# Patient Record
Sex: Female | Born: 1950 | Race: White | Hispanic: No | Smoking: Current every day smoker
Health system: Southern US, Community
[De-identification: ages and names within clinical notes are randomized; demographics above are authoritative.]

## PROBLEM LIST (undated history)

## (undated) DIAGNOSIS — M1990 Unspecified osteoarthritis, unspecified site: Secondary | ICD-10-CM

## (undated) DIAGNOSIS — I10 Essential (primary) hypertension: Secondary | ICD-10-CM

## (undated) HISTORY — PX: ABDOMINAL HYSTERECTOMY: SHX81

## (undated) HISTORY — PX: CHOLECYSTECTOMY: SHX55

---

## 2017-06-02 ENCOUNTER — Encounter (HOSPITAL_COMMUNITY): Payer: Self-pay | Admitting: *Deleted

## 2017-06-02 ENCOUNTER — Emergency Department (HOSPITAL_COMMUNITY)
Admission: EM | Admit: 2017-06-02 | Discharge: 2017-06-02 | Disposition: A | Discharge status: Home / Self Care | Payer: Medicare Other | Attending: Emergency Medicine | Admitting: Emergency Medicine

## 2017-06-02 ENCOUNTER — Emergency Department (HOSPITAL_COMMUNITY): Payer: Medicare Other

## 2017-06-02 DIAGNOSIS — F17200 Nicotine dependence, unspecified, uncomplicated: Secondary | ICD-10-CM | POA: Insufficient documentation

## 2017-06-02 DIAGNOSIS — Y929 Unspecified place or not applicable: Secondary | ICD-10-CM | POA: Diagnosis not present

## 2017-06-02 DIAGNOSIS — Y939 Activity, unspecified: Secondary | ICD-10-CM | POA: Diagnosis not present

## 2017-06-02 DIAGNOSIS — Y999 Unspecified external cause status: Secondary | ICD-10-CM | POA: Insufficient documentation

## 2017-06-02 DIAGNOSIS — W07XXXA Fall from chair, initial encounter: Secondary | ICD-10-CM | POA: Diagnosis not present

## 2017-06-02 DIAGNOSIS — I10 Essential (primary) hypertension: Secondary | ICD-10-CM | POA: Diagnosis not present

## 2017-06-02 DIAGNOSIS — S79911A Unspecified injury of right hip, initial encounter: Secondary | ICD-10-CM | POA: Diagnosis present

## 2017-06-02 DIAGNOSIS — S32591A Other specified fracture of right pubis, initial encounter for closed fracture: Secondary | ICD-10-CM | POA: Insufficient documentation

## 2017-06-02 HISTORY — DX: Essential (primary) hypertension: I10

## 2017-06-02 HISTORY — DX: Unspecified osteoarthritis, unspecified site: M19.90

## 2017-06-02 MED ORDER — TRAMADOL HCL 50 MG PO TABS: 50.0000 mg | ORAL_TABLET | Freq: Four times a day (QID) | ORAL | 0 refills | Status: AC | PRN

## 2017-06-02 MED ORDER — MORPHINE SULFATE (PF) 4 MG/ML IV SOLN
4.0000 mg | Freq: Once | INTRAVENOUS | Status: AC
  Administered 2017-06-02: 4 mg via INTRAVENOUS
  Filled 2017-06-02: qty 1

## 2017-06-02 NOTE — ED Notes (Signed)
Pt refusing to lay in bed at this time due to pain. Pt in wheelchair.

## 2017-06-02 NOTE — ED Triage Notes (Signed)
Pt fell from standing in a chair today, c/o right hip pain, pt denies hitting her head.  Denies on blood thinners.

## 2017-06-02 NOTE — ED Provider Notes (Signed)
[facility] EMERGENCY DEPARTMENT Provider Note   CSN: [id number] Arrival date & time: 06/02/17  1356     History   Chief Complaint Chief Complaint  Patient presents with  . Fall    rt hip pain    HPI Melissa Byrd is a 67 y.o. female.  HPI Patient presents to the emergency room for evaluation of hip and back pain.  Nursing notes indication that the patient fell today however she tells me this actually occurred on Thursday.  Patient was attempting to get off a chair when she fell landing on her right side and hip.  Patient has been having pain in the right groin area as well as her buttock.  She has been trying to treat with home medications.  She borrowed a walker that she can sit on and has been using that to get around.  It hurts whenever she tries to stand.  Also hurts in certain positions.  She said difficulty sleeping because of the pain.  She decided to come into the emergency room for evaluation.  No numbness or weakness.  No loss of consciousness. Past Medical History:  Diagnosis Date  . Arthritis   . Hypertension     There are no active problems to display for this patient.   Past Surgical History:  Procedure Laterality Date  . ABDOMINAL HYSTERECTOMY    . CHOLECYSTECTOMY      OB History    No data available       Home Medications    Prior to Admission medications   Medication Sig Start Date End Date Taking? Authorizing Provider  traMADol (ULTRAM) 50 MG tablet Take 1 tablet (50 mg total) by mouth every 6 (six) hours as needed. 06/02/17   Linwood DibblesKnapp, Eira Alpert, MD    Family History History reviewed. No pertinent family history.  Social History Social History   Tobacco Use  . Smoking status: Current Every Day Smoker  . Smokeless tobacco: Never Used  Substance Use Topics  . Alcohol use: No    Frequency: Never  . Drug use: No     Allergies   Patient has no known allergies.   Review of Systems Review of Systems  All other systems reviewed and are  negative.    Physical Exam Updated Vital Signs BP 127/88   Pulse 95   Temp (!) 97.1 F (36.2 C) (Temporal)   Resp 16   Ht 1.549 m (5\' 1" )   Wt 61.7 kg (136 lb)   SpO2 95%   BMI 25.70 kg/m   Physical Exam  Constitutional: She appears well-developed and well-nourished. No distress.  HENT:  Head: Normocephalic and atraumatic.  Right Ear: External ear normal.  Left Ear: External ear normal.  Eyes: Conjunctivae are normal. Right eye exhibits no discharge. Left eye exhibits no discharge. No scleral icterus.  Neck: Neck supple. No tracheal deviation present.  Cardiovascular: Normal rate.  Pulmonary/Chest: Effort normal. No stridor. No respiratory distress.  Abdominal: She exhibits no distension.  Musculoskeletal: She exhibits tenderness. She exhibits no edema.       Right shoulder: Normal.       Right elbow: Normal.      Right hip: She exhibits tenderness and bony tenderness. She exhibits normal range of motion and no swelling.  The patient is able to flex her hip without difficulty  Neurological: She is alert. Cranial nerve deficit: no gross deficits.  Skin: Skin is warm and dry. No rash noted.  Psychiatric: She has a normal mood and  affect.  Nursing note and vitals reviewed.    ED Treatments / Results      Radiology Dg Lumbar Spine Complete  Result Date: 06/02/2017 CLINICAL DATA:  66 y/o  female status post fall with pain. EXAM: LUMBAR SPINE - COMPLETE 4+ VIEW COMPARISON:  Right hip series today reported separately. FINDINGS: Osteopenia in the spine. Calcified aortic atherosclerosis. Cholecystectomy surgical clips. Normal lumbar segmentation. Mild grade 1 anterolisthesis of L5 on S1. Associated facet hypertrophy, but no associated L5 pars fractures are identified. Lumbar and visible lower thoracic vertebral body height appears maintained. Disc space loss and endplate spurring in the lower thoracic spine and T12-L1. Relatively preserved lumbar disc spaces. SI joints appear  intact. On the lateral view no sacral fracture is evident. Abundant bowel gas in the abdomen but nonobstructed bowel-gas pattern. IMPRESSION: 1. Osteopenia.  No acute fracture identified in the lumbar spine. If symptoms persist or occult compression fracture is suspected Lumbar MRI without contrast or Nuclear Medicine Whole-body Bone Scan would evaluate with the highest sensitivity. 2. Chronic appearing grade 1 spondylolisthesis at L5-S1 with facet arthropathy. 3.  Aortic Atherosclerosis (ICD10-I70.0). Electronically Signed   By: Odessa Fleming M.D.   On: 06/02/2017 15:53   Ct Hip Right Wo Contrast  Result Date: 06/02/2017 CLINICAL DATA:  Right hip pain after falling from a chair today. EXAM: CT OF THE RIGHT HIP WITHOUT CONTRAST TECHNIQUE: Multidetector CT imaging of the right hip was performed according to the standard protocol. Multiplanar CT image reconstructions were also generated. COMPARISON:  Right hip radiograph same date. FINDINGS: Bones/Joint/Cartilage Examination is limited to the right hip and inferior right hemipelvis. The bones are adequately mineralized. The femoral head is located. There is no evidence of proximal femur fracture, dislocation or avascular necrosis. There is mild irregularity of the inferior pubic ramus on the axial images which could reflect a nondisplaced fracture. No definite abnormality of the superior pubic ramus, symphysis pubis or right sacroiliac joint. No significant right hip joint effusion or arthropathy. Ligaments Suboptimally assessed by CT. Muscles and Tendons The right hip muscles and tendons appear unremarkable. Soft tissues Iliofemoral atherosclerosis noted. Mild sigmoid colon diverticular changes. No evidence of periarticular hematoma. IMPRESSION: 1. No evidence of acute right femur fracture or dislocation. 2. Possible nondisplaced fracture of the right inferior pubic ramus. 3. Iliofemoral atherosclerosis. Electronically Signed   By: Carey Bullocks M.D.   On: 06/02/2017  17:01   Dg Hip Unilat W Or Wo Pelvis 2-3 Views Right  Result Date: 06/02/2017 CLINICAL DATA:  67 y/o   female status post fall with pain. EXAM: DG HIP (WITH OR WITHOUT PELVIS) 2-3V RIGHT COMPARISON:  None. FINDINGS: Femoral heads are normally located. Hip joint spaces are preserved. Bone mineralization is within normal limits for age. Pelvis intact. SI joints appear symmetric. Grossly intact proximal left femur. Proximal right femur is intact. Calcified iliofemoral atherosclerosis. Negative visible bowel gas pattern. IMPRESSION: No acute fracture or dislocation identified about the right hip or pelvis. Electronically Signed   By: Odessa Fleming M.D.   On: 06/02/2017 15:49    Procedures Procedures (including critical care time)  Medications Ordered in ED Medications  morphine 4 MG/ML injection 4 mg (4 mg Intravenous Given 06/02/17 1633)     Initial Impression / Assessment and Plan / ED Course  I have reviewed the triage vital signs and the nursing notes.  Pertinent labs & imaging results that were available during my care of the patient were reviewed by me and considered in my  medical decision making (see chart for details).  Clinical Course as of Jun 03 1719  Sat Jun 02, 2017  1611 Pt initially did not want pain medications.  Just wanted xrays.  Now would like pain meds.  Since she is having trouble standing, will ct to evaluate for occult fx  [JK]    Clinical Course User Index [JK] Linwood Dibbles, MD    Patient CT scan suggest the possibility of a pubic ramus fracture.  This correlates with the symptoms she is having.  Fortunately no evidence of hip fracture.  Patient was treated with pain medications.  She has a walker that she is using at home.  Plan on discharge home with prescription for pain medications.  Discussed outpatient follow-up with orthopedics.  Final Clinical Impressions(s) / ED Diagnoses   Final diagnoses:  Closed fracture of right inferior pubic ramus, initial encounter [facility])      ED Discharge Orders        Ordered    traMADol (ULTRAM) 50 MG tablet  Every 6 hours PRN     06/02/17 1718       Linwood Dibbles, MD 06/02/17 1721

## 2017-06-02 NOTE — Discharge Instructions (Signed)
Take the medications as needed for pain.  Continue use the walker to help keep off of your leg.  This fracture should heal over time without any surgery.  Follow-up with an orthopedic doctor to make sure it is healing properly.  I have listed the name of the orthopedic doctors.  [place] and Dr. Romeo AppleHarrison is based in [place]

## 2019-01-30 IMAGING — CT CT HIP*R* W/O CM
2 of 3 series · 17 of 46 positions shown, 19 images · non-contrast
Comparison: Right hip radiograph same date.

CLINICAL DATA: Right hip pain after falling from a chair today.

EXAM:
CT OF THE RIGHT HIP WITHOUT CONTRAST
TECHNIQUE: Multidetector CT imaging of the right hip was performed according to
the standard protocol. Multiplanar CT image reconstructions were
also generated.

[Series 3: axial st · axial · 0.43mm/px · z∈[+824,+972]mm · 14 of 86 slices shown, 16 images]
[im 6/86  soft-tissue]
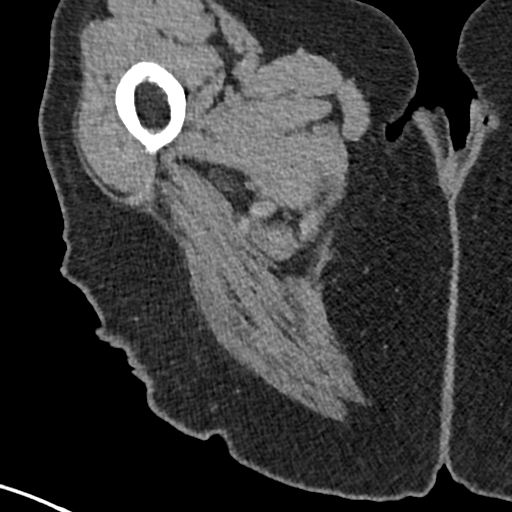
[im 6/86  bone]
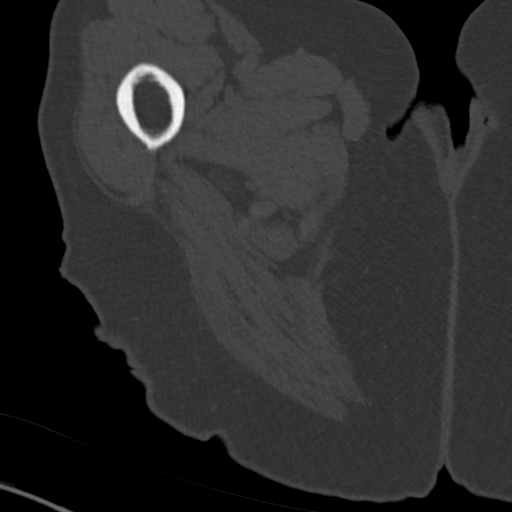
[im 11/86  soft-tissue]
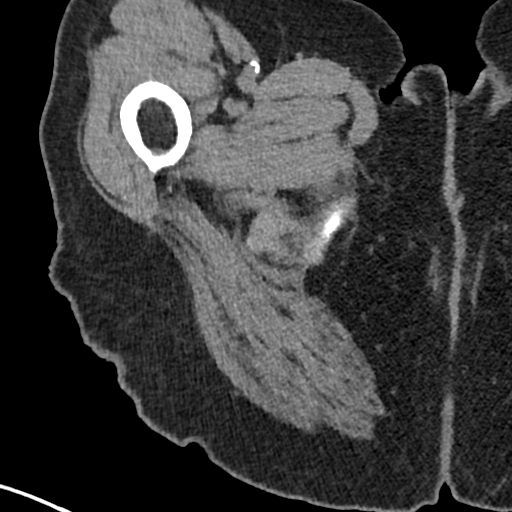
[im 17/86  soft-tissue]
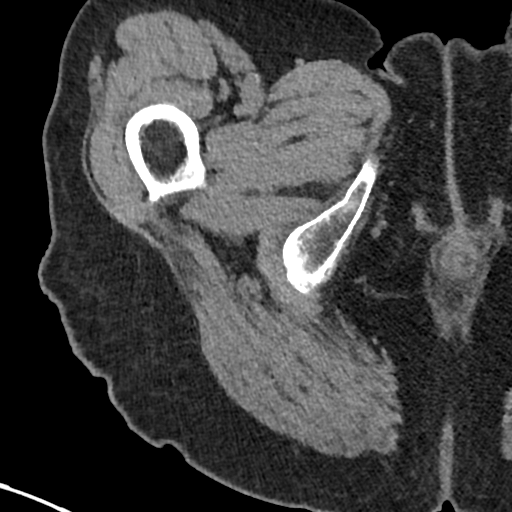
[im 22/86  soft-tissue]
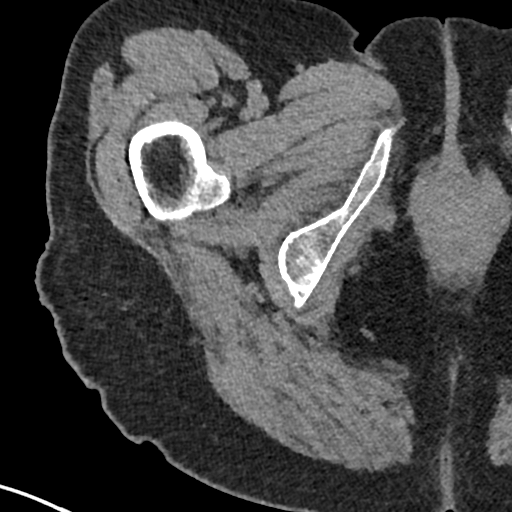
[im 28/86  soft-tissue]
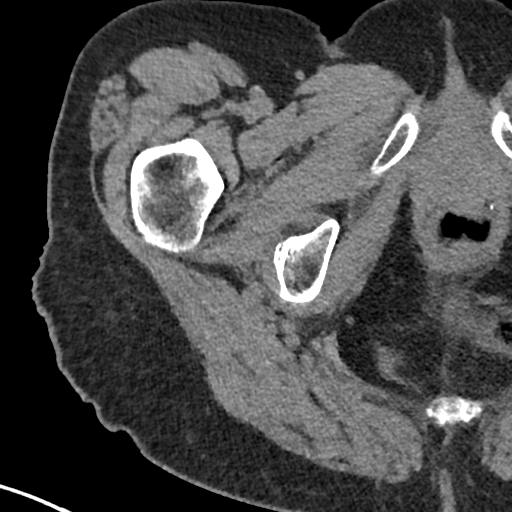
[im 33/86  soft-tissue]
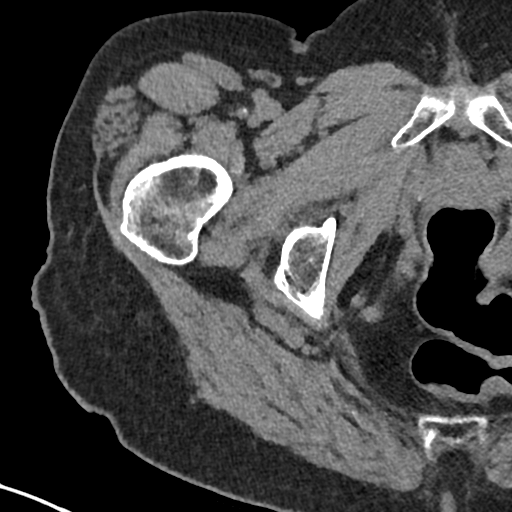
[im 39/86  soft-tissue]
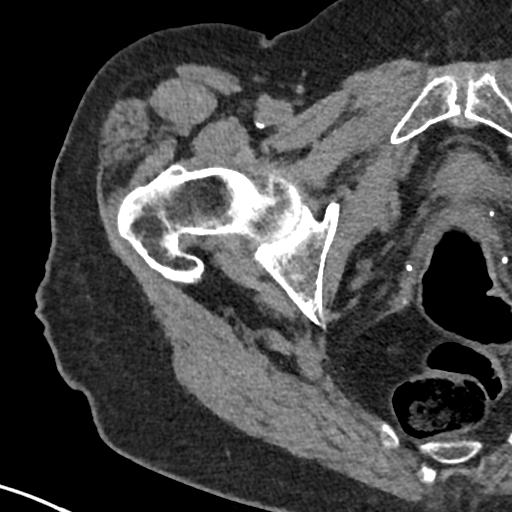
[im 47/86  soft-tissue]
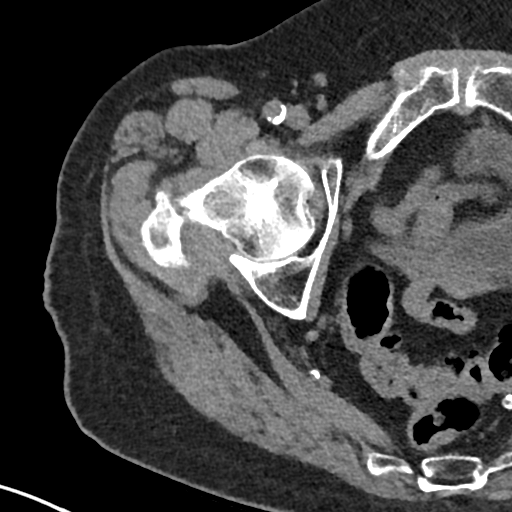
[im 53/86  soft-tissue]
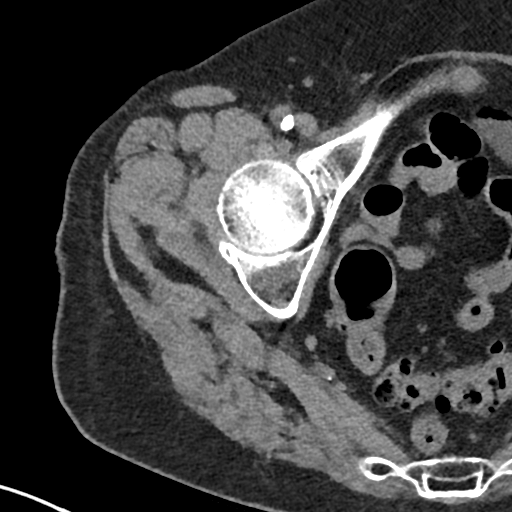
[im 53/86  bone]
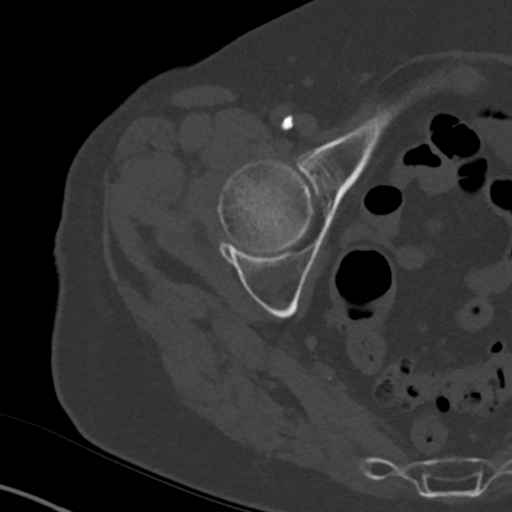
[im 58/86  soft-tissue]
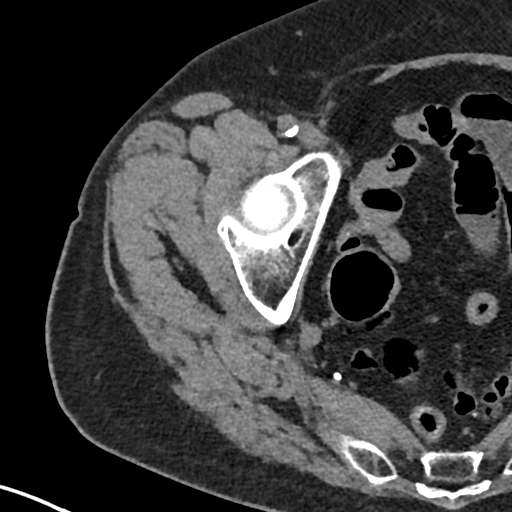
[im 64/86  soft-tissue]
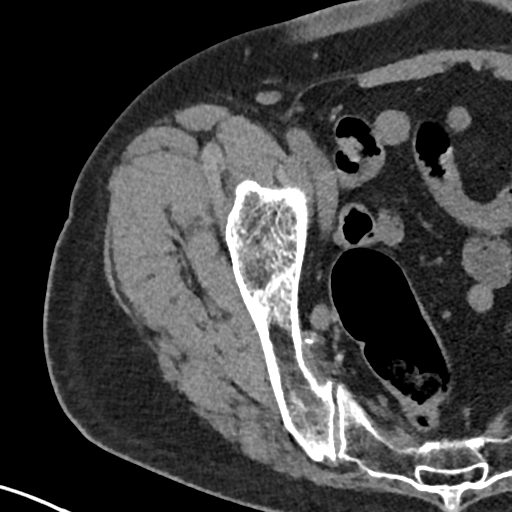
[im 69/86  soft-tissue]
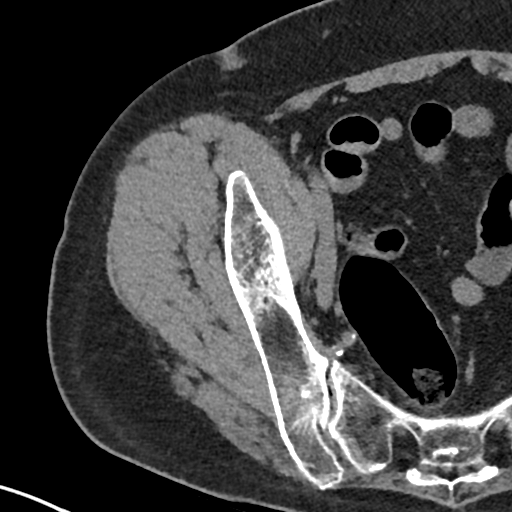
[im 75/86  soft-tissue]
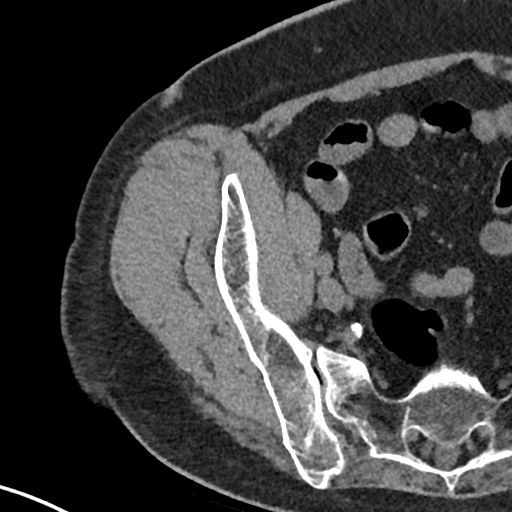
[im 80/86  soft-tissue]
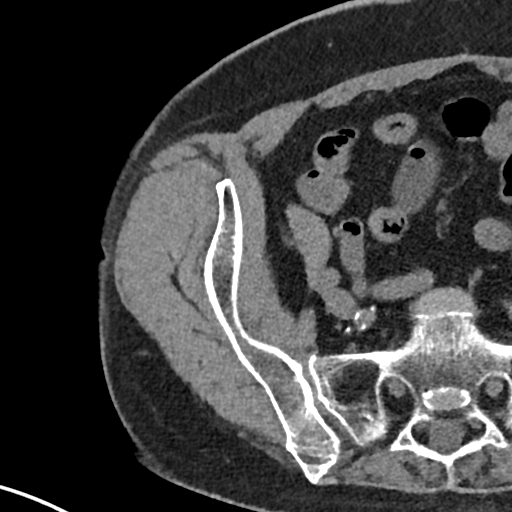

[Series 8: coronal st · coronal · 0.37mm/px · 3 of 75 slices shown]
[im 25/75  soft-tissue]
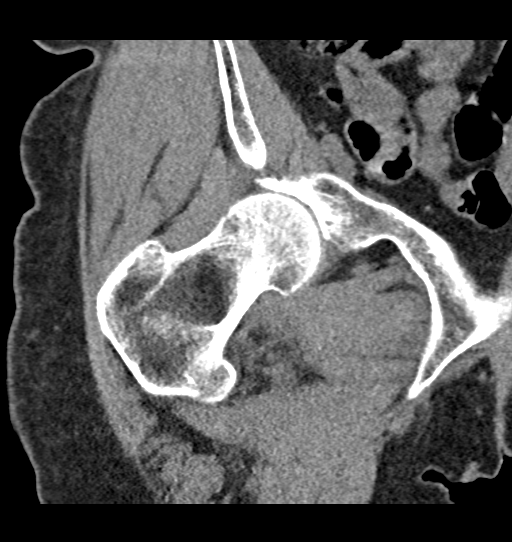
[im 33/75  soft-tissue]
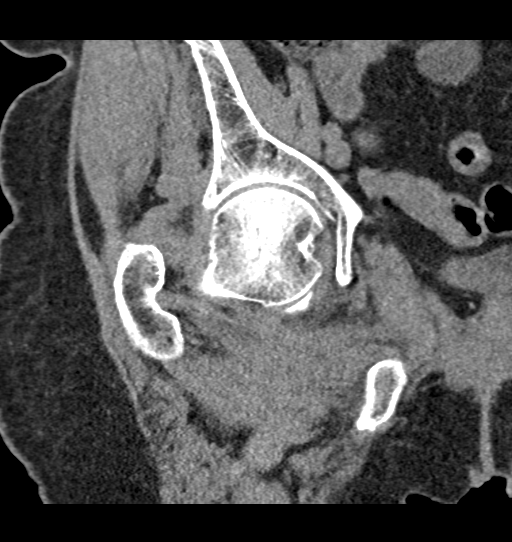
[im 42/75  soft-tissue]
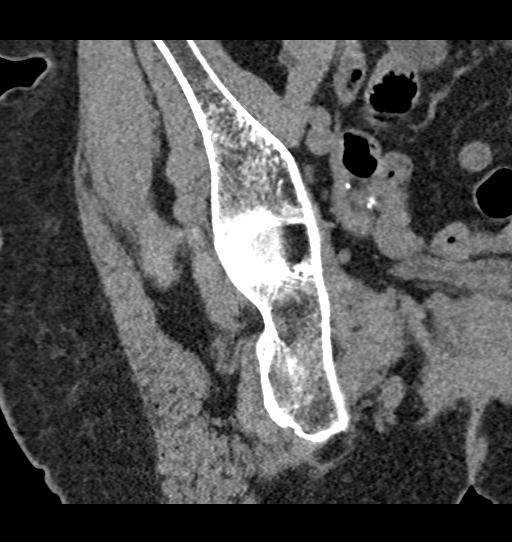

[17 of 46 positions shown; findings below may reference images not displayed]

FINDINGS: Bones/Joint/Cartilage

Examination is limited to the right hip and inferior right
hemipelvis. The bones are adequately mineralized. The femoral head
is located. There is no evidence of proximal femur fracture,
dislocation or avascular necrosis. There is mild irregularity of the
inferior pubic ramus on the axial images which could reflect a
nondisplaced fracture. No definite abnormality of the superior pubic
ramus, symphysis pubis or right sacroiliac joint.

No significant right hip joint effusion or arthropathy.

Ligaments

Suboptimally assessed by CT.

Muscles and Tendons

The right hip muscles and tendons appear unremarkable.

Soft tissues

Iliofemoral atherosclerosis noted. Mild sigmoid colon diverticular
changes. No evidence of periarticular hematoma.
IMPRESSION: 1. No evidence of acute right femur fracture or dislocation.
2. Possible nondisplaced fracture of the right inferior pubic ramus.
3. Iliofemoral atherosclerosis.
